# Patient Record
Sex: Male | Born: 1995 | Race: White | Hispanic: Yes | Marital: Single | Smoking: Current every day smoker
Health system: Southern US, Community
[De-identification: ages and names within clinical notes are randomized; demographics above are authoritative.]

## PROBLEM LIST (undated history)

## (undated) HISTORY — PX: TONSILLECTOMY: SUR1361

---
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
## 2018-12-17 ENCOUNTER — Other Ambulatory Visit: Payer: Self-pay

## 2018-12-17 ENCOUNTER — Encounter: Payer: Self-pay | Admitting: Emergency Medicine

## 2018-12-17 ENCOUNTER — Emergency Department (INDEPENDENT_AMBULATORY_CARE_PROVIDER_SITE_OTHER)
Admission: EM | Admit: 2018-12-17 | Discharge: 2018-12-17 | Disposition: A | Discharge status: Home / Self Care | Payer: 59 | Source: Home / Self Care | Attending: Emergency Medicine | Admitting: Emergency Medicine

## 2018-12-17 ENCOUNTER — Emergency Department (INDEPENDENT_AMBULATORY_CARE_PROVIDER_SITE_OTHER): Payer: 59

## 2018-12-17 DIAGNOSIS — R05 Cough: Secondary | ICD-10-CM | POA: Diagnosis not present

## 2018-12-17 DIAGNOSIS — J101 Influenza due to other identified influenza virus with other respiratory manifestations: Secondary | ICD-10-CM | POA: Diagnosis not present

## 2018-12-17 DIAGNOSIS — R0789 Other chest pain: Secondary | ICD-10-CM | POA: Diagnosis not present

## 2018-12-17 DIAGNOSIS — J209 Acute bronchitis, unspecified: Secondary | ICD-10-CM

## 2018-12-17 LAB — POCT INFLUENZA A/B
INFLUENZA A, POC: POSITIVE — AB
Influenza B, POC: NEGATIVE

## 2018-12-17 MED ORDER — CEFDINIR 300 MG PO CAPS: 600.0000 mg | ORAL_CAPSULE | Freq: Every day | ORAL | 0 refills | Status: AC

## 2018-12-17 MED ORDER — BENZONATATE 100 MG PO CAPS: 100.0000 mg | ORAL_CAPSULE | Freq: Three times a day (TID) | ORAL | 0 refills | Status: AC | PRN

## 2018-12-17 NOTE — ED Triage Notes (Signed)
The patient presented to the [facility] with a complaint of a cough, fever, body aches, chills and headaches x 4 days.

## 2018-12-17 NOTE — Discharge Instructions (Signed)
Please rest and drink fluids. Take antibiotics as instructed. If you develop worsening shortness of breath or worsening chest pain with difficulty breathing please go to the nearest emergency room. Take Mucinex twice a day. You have cough capsules to help with your cough.

## 2018-12-17 NOTE — ED Provider Notes (Signed)
[facility]    CSN: [id number] Arrival date & time: 12/17/18  1312     History   Chief Complaint Chief Complaint  Patient presents with  . Cough    HPI James Brady is a 23 y.o. male.  Patient states he became ill on Tuesday.  He started with fever, headache, myalgias, cold symptoms, and temperature to 101.  He started taking Tylenol and intermittently ibuprofen.  He has tried to drink fluids over the last 3 to 4 days.  Of note he does vape and he does smoke.  No one else at home has been sick.  He did not receive a flu shot this year.  He had one episode of nausea and dry heaves this morning.  His cough has been minimally productive with minimal color. HPI  History reviewed. No pertinent past medical history.  Patient Active Problem List   Diagnosis Date Noted  . Dislocation of proximal interphalangeal joint of right ring finger 10/28/2012  . Turf toe 10/28/2012  . ACUTE SEROUS OTITIS MEDIA 03/26/2010  . CONTUSION, THUMB 03/26/2010    Past Surgical History:  Procedure Laterality Date  . TONSILLECTOMY         Home Medications    Prior to Admission medications   Medication Sig Start Date End Date Taking? Authorizing Provider  acetaminophen (TYLENOL) 325 MG tablet Take 650 mg by mouth every 6 (six) hours as needed.   Yes [provider]  benzonatate (TESSALON) 100 MG capsule Take 1-2 capsules (100-200 mg total) by mouth 3 (three) times daily as needed for cough. 12/17/18   Collene Gobble, MD  cefdinir (OMNICEF) 300 MG capsule Take 2 capsules (600 mg total) by mouth daily. 12/17/18   Collene Gobble, MD    Family History Family History  Problem Relation Age of Onset  . Diabetes Sister     Social History Social History   Tobacco Use  . Smoking status: Current Every Day Smoker    Packs/day: 0.50    Types: Cigarettes  . Smokeless tobacco: Never Used  Substance Use Topics  . Alcohol use: No  . Drug use: No     Allergies   Patient has no  known allergies.   Review of Systems Review of Systems  Constitutional: Positive for chills, fatigue and fever.  HENT: Positive for congestion.   Eyes: Negative.   Respiratory: Positive for cough and shortness of breath.   Cardiovascular: Positive for chest pain.  Gastrointestinal: Positive for nausea.  Genitourinary: Negative.   Musculoskeletal: Positive for myalgias.  Hematological: Negative.      Physical Exam Triage Vital Signs ED Triage Vitals  Enc Vitals Group     BP 12/17/18 1343 139/79     Pulse Rate 12/17/18 1343 90     Resp 12/17/18 1343 18     Temp 12/17/18 1343 100.2 F (37.9 C)     Temp Source 12/17/18 1343 Oral     SpO2 12/17/18 1343 97 %     Weight --      Height --      Head Circumference --      Peak Flow --      Pain Score 12/17/18 1344 4     Pain Loc --      Pain Edu? --      Excl. in GC? --    No data found.  Updated Vital Signs BP 139/79 (BP Location: Right Arm)   Pulse 90   Temp 100.2 F (37.9  C) (Oral)   Resp 18   SpO2 97%   Visual Acuity Right Eye Distance:   Left Eye Distance:   Bilateral Distance:    Right Eye Near:   Left Eye Near:    Bilateral Near:     Physical Exam Constitutional:      General: He is not in acute distress.    Appearance: Normal appearance. He is diaphoretic.  HENT:     Head: Normocephalic.     Right Ear: Tympanic membrane normal.     Left Ear: Tympanic membrane normal.     Nose: Nose normal.     Mouth/Throat:     Mouth: Mucous membranes are dry.  Eyes:     Pupils: Pupils are equal, round, and reactive to light.  Neck:     Musculoskeletal: Normal range of motion and neck supple.  Cardiovascular:     Rate and Rhythm: Normal rate and regular rhythm.     Heart sounds: Normal heart sounds. No murmur.  Pulmonary:     Effort: Pulmonary effort is normal.     Breath sounds: Normal breath sounds. No wheezing or rhonchi.  Chest:     Chest wall: No tenderness.  Abdominal:     General: Abdomen is flat.       Palpations: Abdomen is soft.  Musculoskeletal: Normal range of motion.  Skin:    General: Skin is warm.  Neurological:     Mental Status: He is alert.      UC Treatments / Results  Labs (all labs ordered are listed, but only abnormal results are displayed) Labs Reviewed  POCT INFLUENZA A/B - Abnormal; Notable for the following components:      Result Value   Influenza A, POC Positive (*)    All other components within normal limits    EKG None  Radiology Dg Chest 2 View  Result Date: 12/17/2018 CLINICAL DATA:  Cough central chest EXAM: CHEST - 2 VIEW COMPARISON:  None. FINDINGS: Cardiomediastinal silhouette is normal. Mediastinal contours appear intact. There is no evidence of focal airspace consolidation, pleural effusion or pneumothorax. Mild peribronchial thickening centrally. Osseous structures are without acute abnormality. Soft tissues are grossly normal. IMPRESSION: Mild peribronchial thickening centrally which may be seen with acute bronchitis. Electronically Signed   By: Ted Mcalpine M.D.   On: 12/17/2018 14:13    Procedures Procedures (including critical care time)  Medications Ordered in UC Medications - No data to display  Initial Impression / Assessment and Plan / UC Course  I have reviewed the triage vital signs and the nursing notes.  Pertinent labs & imaging results that were available during my care of the patient were reviewed by me and considered in my medical decision making (see chart for details). Patient is 4 days into illness.  His flu test for influenza A was positive.  His chest x-ray shows peribronchial thickening consistent with bronchitis.  We will go ahead and cover with [facility] for secondary infection.  He was encouraged to force fluids and rest.  If he develops worsening chest pain or shortness of breath he is to present to the ER.    Final Clinical Impressions(s) / UC Diagnoses   Final diagnoses:  Influenza A  Acute  bronchitis, unspecified organism     Discharge Instructions     Please rest and drink fluids. Take antibiotics as instructed. If you develop worsening shortness of breath or worsening chest pain with difficulty breathing please go to the nearest emergency room. Take Mucinex  twice a day. You have cough capsules to help with your cough.    ED Prescriptions    Medication Sig Dispense Auth. Provider   cefdinir (OMNICEF) 300 MG capsule Take 2 capsules (600 mg total) by mouth daily. 20 capsule Collene Gobble, MD   benzonatate (TESSALON) 100 MG capsule Take 1-2 capsules (100-200 mg total) by mouth 3 (three) times daily as needed for cough. 40 capsule Collene Gobble, MD     Controlled Substance Prescriptions [place] Controlled Substance Registry consulted? Not Applicable   Collene Gobble, MD 12/17/18 1426

## 2020-03-15 IMAGING — DX DG CHEST 2V
2 series · 2 of 2 positions shown · non-contrast
Comparison: None.

CLINICAL DATA: Cough central chest

EXAM:
CHEST - 2 VIEW

[chest pa]
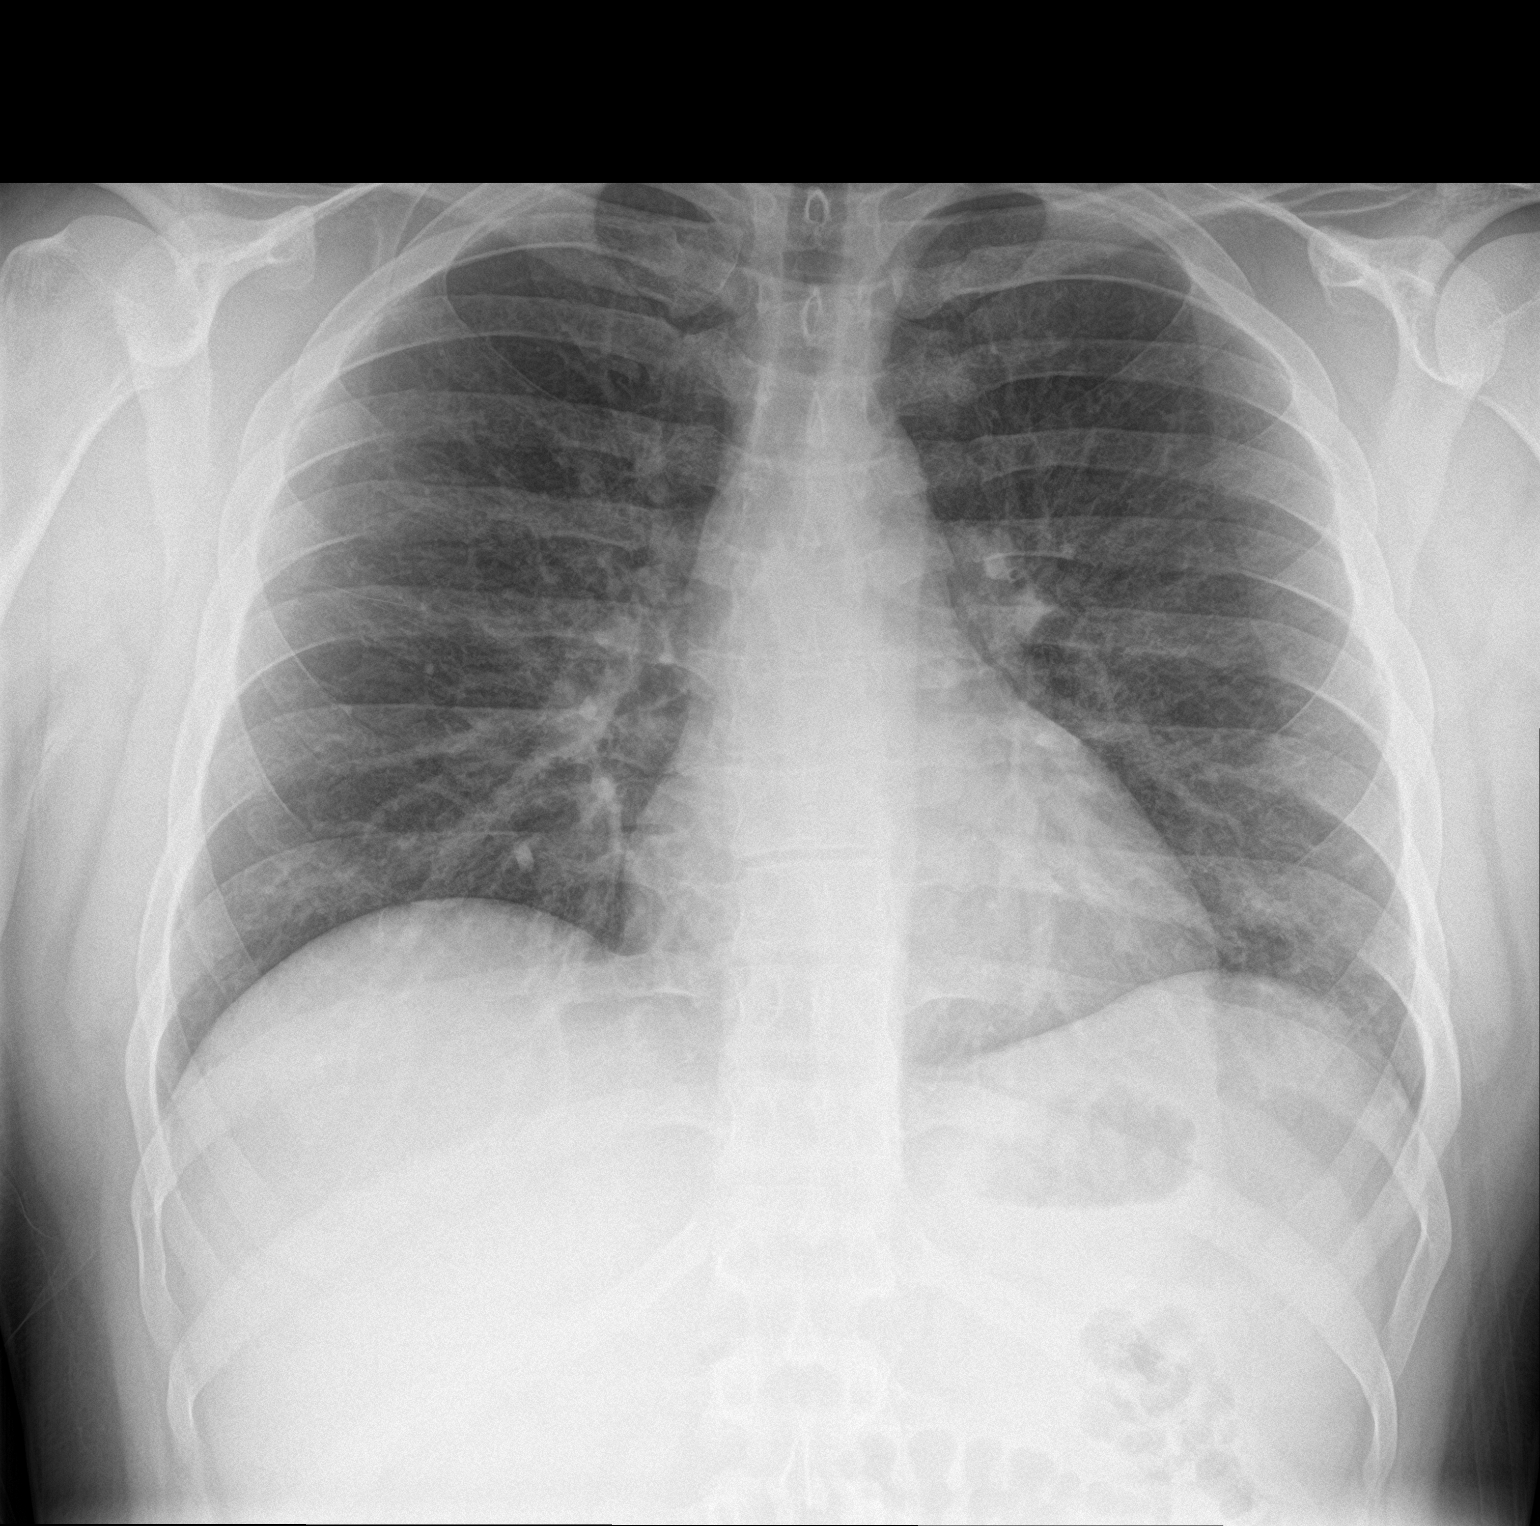

[chest lat]
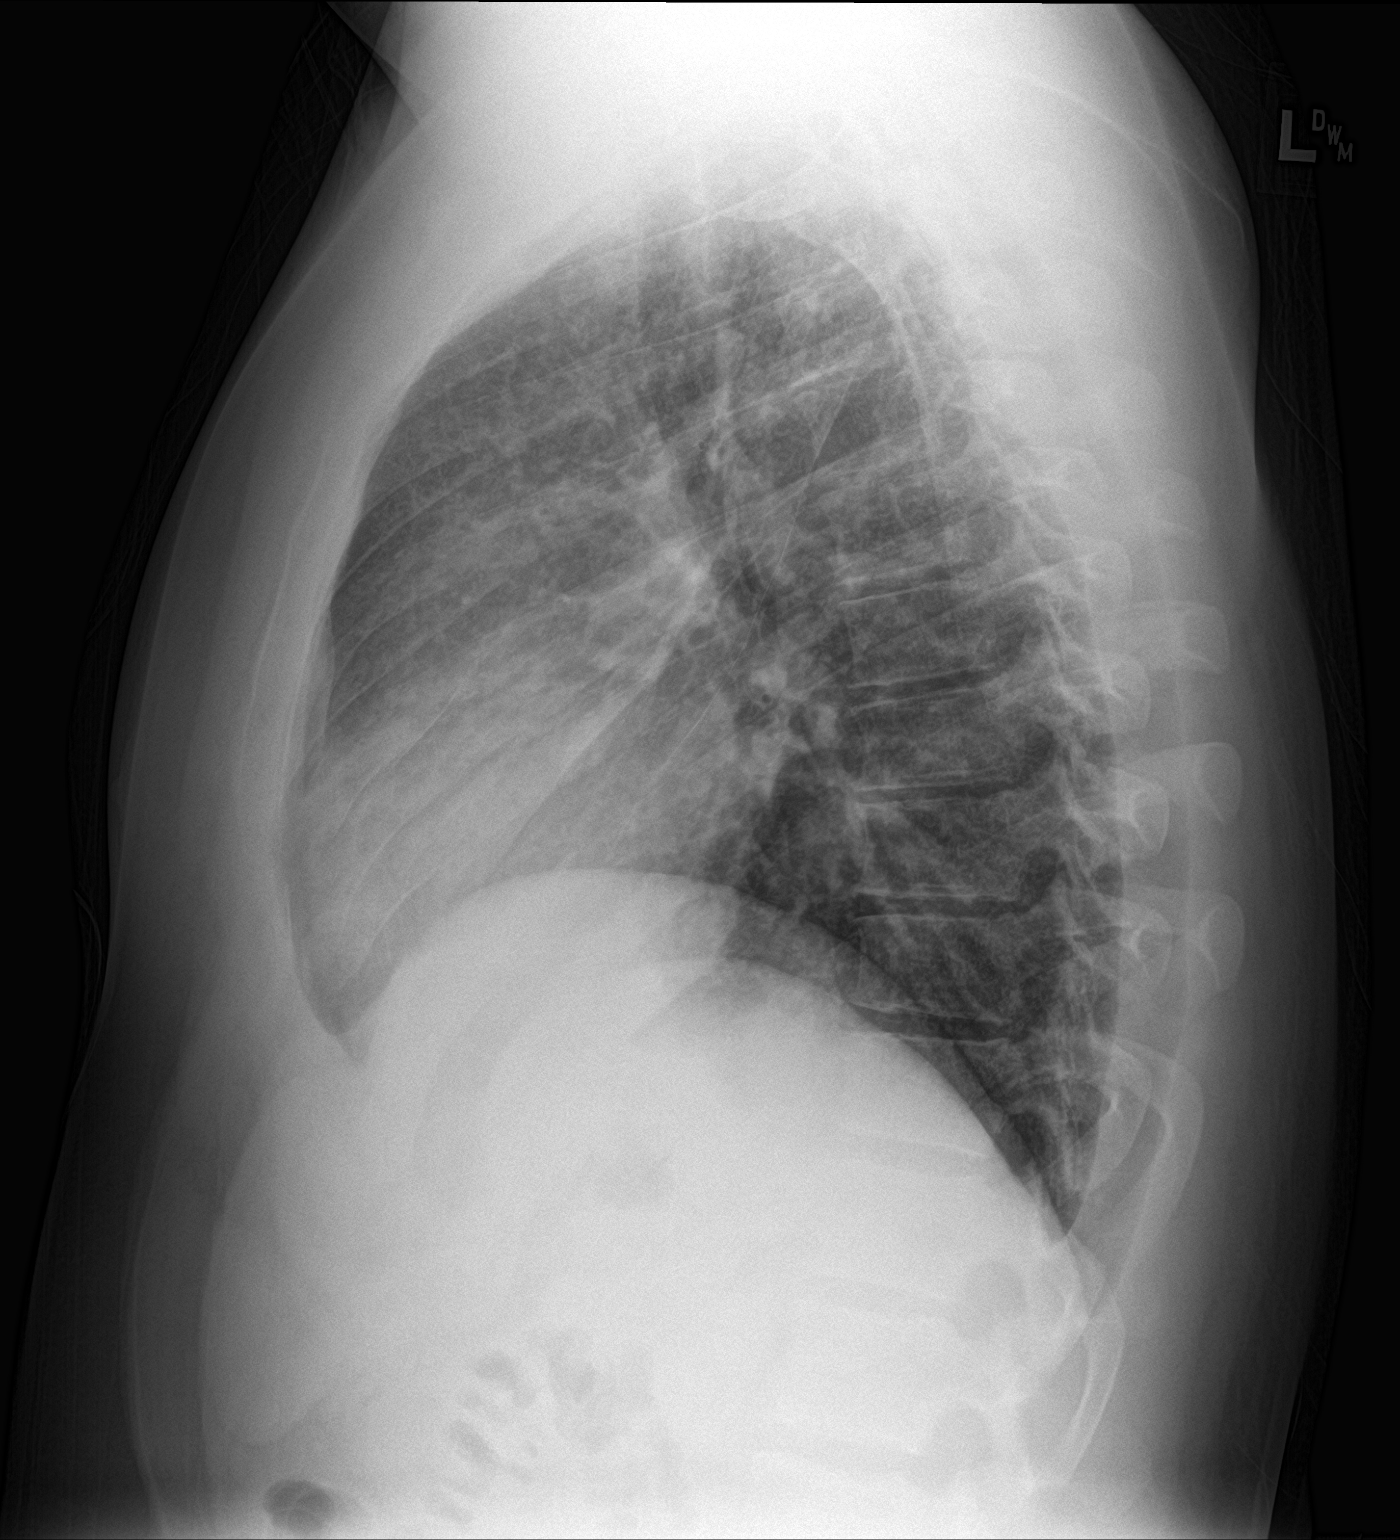

[2 of 2 positions shown; findings below may reference images not displayed]

FINDINGS: Cardiomediastinal silhouette is normal. Mediastinal contours appear
intact.

There is no evidence of focal airspace consolidation, pleural
effusion or pneumothorax. Mild peribronchial thickening centrally.

Osseous structures are without acute abnormality. Soft tissues are
grossly normal.
IMPRESSION: Mild peribronchial thickening centrally which may be seen with acute
bronchitis.
